# Patient Record
Sex: Female | Born: 1975 | Race: White | Hispanic: No | Marital: Married | Smoking: Former smoker
Health system: Southern US, Community
[De-identification: ages and names within clinical notes are randomized; demographics above are authoritative.]

## PROBLEM LIST (undated history)

## (undated) DIAGNOSIS — K219 Gastro-esophageal reflux disease without esophagitis: Secondary | ICD-10-CM

## (undated) DIAGNOSIS — B019 Varicella without complication: Secondary | ICD-10-CM

## (undated) DIAGNOSIS — Z975 Presence of (intrauterine) contraceptive device: Secondary | ICD-10-CM

## (undated) HISTORY — DX: Presence of (intrauterine) contraceptive device: Z97.5

## (undated) HISTORY — PX: WISDOM TOOTH EXTRACTION: SHX21

## (undated) HISTORY — DX: Gastro-esophageal reflux disease without esophagitis: K21.9

## (undated) HISTORY — DX: Varicella without complication: B01.9

---

## 2010-12-05 DIAGNOSIS — F40243 Fear of flying: Secondary | ICD-10-CM | POA: Insufficient documentation

## 2015-06-03 DIAGNOSIS — R922 Inconclusive mammogram: Secondary | ICD-10-CM | POA: Insufficient documentation

## 2015-12-13 HISTORY — PX: BREAST BIOPSY: SHX20

## 2019-07-27 LAB — HM PAP SMEAR: HM Pap smear: NEGATIVE

## 2020-07-26 LAB — HM MAMMOGRAPHY

## 2020-12-30 ENCOUNTER — Encounter: Payer: Self-pay | Admitting: Family Medicine

## 2020-12-30 ENCOUNTER — Other Ambulatory Visit: Payer: Self-pay

## 2020-12-30 ENCOUNTER — Ambulatory Visit (INDEPENDENT_AMBULATORY_CARE_PROVIDER_SITE_OTHER): Payer: BC Managed Care – PPO | Admitting: Family Medicine

## 2020-12-30 VITALS — BP 116/62 | HR 66 | Temp 98.3°F | Ht 67.32 in | Wt 154.1 lb

## 2020-12-30 DIAGNOSIS — Z13220 Encounter for screening for lipoid disorders: Secondary | ICD-10-CM

## 2020-12-30 DIAGNOSIS — Z0000 Encounter for general adult medical examination without abnormal findings: Secondary | ICD-10-CM | POA: Diagnosis not present

## 2020-12-30 DIAGNOSIS — Z1329 Encounter for screening for other suspected endocrine disorder: Secondary | ICD-10-CM | POA: Diagnosis not present

## 2020-12-30 DIAGNOSIS — Z23 Encounter for immunization: Secondary | ICD-10-CM

## 2020-12-30 DIAGNOSIS — F40243 Fear of flying: Secondary | ICD-10-CM

## 2020-12-30 MED ORDER — ALPRAZOLAM 0.5 MG PO TABS: ORAL_TABLET | ORAL | 0 refills | Status: DC

## 2020-12-30 NOTE — Assessment & Plan Note (Signed)
Well adult Orders Placed This Encounter  Procedures  . Flu Vaccine QUAD 6+ mos PF IM (Fluarix Quad PF)  . COMPLETE METABOLIC PANEL WITH GFR  . CBC  . Lipid Profile  . TSH  Screening: Lipid panel ordered.  Immunizations: Flu vaccine given. Plans to get COVID booster.  Anticipatory guidance/Risk factor reduction:  Recommendations per AVS

## 2020-12-30 NOTE — Progress Notes (Signed)
Misty Spencer - 45 y.o. female MRN [id number]  Date of birth: 1976/01/08  Subjective Chief Complaint  Patient presents with  . Establish Care    HPI Misty Spencer is a 45 y.o. female here today for initial visit to establish care.  She has been in good health.  Takes a couple OTC supplements.  Has used alprazolam when flying due to anxiety.  Has trip to [place] in a couple of months and would like a renewal of this.    She sees GYN at [facility].  Pap and Mammogram up to date.   MGF has history of colon cancer, no first degree relatives.   Exercises regularly.  Feels like her diet is healthy.    Non smoker.  Has wine a few times per week.    Had COVID last month, recovered well.  Still with mild cough.  Plans on getting booster.    Would like flu shot today.   Would like to have updated labs.    ROS:  A comprehensive ROS was completed and negative except as noted per HPI  No Known Allergies  Past Medical History:  Diagnosis Date  . IUD (intrauterine device) in place     Past Surgical History:  Procedure Laterality Date  . WISDOM TOOTH EXTRACTION      Social History   Socioeconomic History  . Marital status: Married    Spouse name: Not on file  . Number of children: 2  . Years of education: Not on file  . Highest education level: Not on file  Occupational History  . Occupation: Field seismologist  Tobacco Use  . Smoking status: Former Games developer  . Smokeless tobacco: Never Used  Vaping Use  . Vaping Use: Never used  Substance and Sexual Activity  . Alcohol use: Yes    Alcohol/week: 3.0 standard drinks    Types: 3 Standard drinks or equivalent per week  . Drug use: Never  . Sexual activity: Yes    Partners: Male    Birth control/protection: I.U.D.  Other Topics Concern  . Not on file  Social History Narrative  . Not on file   Social Determinants of Health   Financial Resource Strain: Not on file  Food Insecurity: Not on file   Transportation Needs: Not on file  Physical Activity: Not on file  Stress: Not on file  Social Connections: Not on file    Family History  Problem Relation Age of Onset  . Diabetes Father   . Colon cancer Maternal Grandfather     Health Maintenance  Topic Date Due  . Hepatitis C Screening  Never done  . COVID-19 Vaccine (1) Never done  . HIV Screening  Never done  . PAP SMEAR-Modifier  Never done  . TETANUS/TDAP  03/30/2023  . INFLUENZA VACCINE  Completed     ----------------------------------------------------------------------------------------------------------------------------------------------------------------------------------------------------------------- Physical Exam BP 116/62 (BP Location: Left Arm, Patient Position: Sitting, Cuff Size: Normal)   Pulse 66   Temp 98.3 F (36.8 C) (Oral)   Ht 5' 7.32" (1.71 m)   Wt 154 lb 1.6 oz (69.9 kg)   SpO2 100%   BMI 23.90 kg/m   Physical Exam Constitutional:      General: She is not in acute distress.    Appearance: Normal appearance. She is well-nourished.  HENT:     Head: Normocephalic and atraumatic.     Right Ear: Tympanic membrane normal.     Left Ear: Tympanic membrane normal.     Nose: Nose normal.  Mouth/Throat:     Mouth: Oropharynx is clear and moist.  Eyes:     General: No scleral icterus.    Conjunctiva/sclera: Conjunctivae normal.  Neck:     Thyroid: No thyromegaly.  Cardiovascular:     Rate and Rhythm: Normal rate and regular rhythm.     Heart sounds: Normal heart sounds.  Pulmonary:     Effort: Pulmonary effort is normal.     Breath sounds: Normal breath sounds.  Abdominal:     General: Bowel sounds are normal. There is no distension.     Palpations: Abdomen is soft.     Tenderness: There is no abdominal tenderness. There is no guarding.  Musculoskeletal:        General: No edema. Normal range of motion.     Cervical back: Normal range of motion and neck supple.  Lymphadenopathy:      Cervical: No cervical adenopathy.  Skin:    General: Skin is warm and dry.     Findings: No rash.  Neurological:     Mental Status: She is alert and oriented to person, place, and time.     Cranial Nerves: No cranial nerve deficit.     Coordination: Coordination normal.  Psychiatric:        Mood and Affect: Mood and affect normal.        Behavior: Behavior normal.     ------------------------------------------------------------------------------------------------------------------------------------------------------------------------------------------------------------------- Assessment and Plan  Well adult exam Well adult Orders Placed This Encounter  Procedures  . Flu Vaccine QUAD 6+ mos PF IM (Fluarix Quad PF)  . COMPLETE METABOLIC PANEL WITH GFR  . CBC  . Lipid Profile  . TSH  Screening: Lipid panel ordered.  Immunizations: Flu vaccine given. Plans to get COVID booster.  Anticipatory guidance/Risk factor reduction:  Recommendations per AVS  Anxiety with flying Alprazolam renewed.     Meds ordered this encounter  Medications  . ALPRAZolam (XANAX) 0.5 MG tablet    Sig: 1 tab PO half hour prior to flight.  May repeat if needed during flight.    Dispense:  15 tablet    Refill:  0    No follow-ups on file.    This visit occurred during the SARS-CoV-2 public health emergency.  Safety protocols were in place, including screening questions prior to the visit, additional usage of staff PPE, and extensive cleaning of exam room while observing appropriate contact time as indicated for disinfecting solutions.

## 2020-12-30 NOTE — Assessment & Plan Note (Signed)
Alprazolam renewed 

## 2020-12-30 NOTE — Patient Instructions (Signed)
Great to meet you today!  Preventive Care 45-45 Years Old, Female Preventive care refers to lifestyle choices and visits with your health care provider that can promote health and wellness. This includes:  A yearly physical exam. This is also called an annual wellness visit.  Regular dental and eye exams.  Immunizations.  Screening for certain conditions.  Healthy lifestyle choices, such as: ? Eating a healthy diet. ? Getting regular exercise. ? Not using drugs or products that contain nicotine and tobacco. ? Limiting alcohol use. What can I expect for my preventive care visit? Physical exam Your health care provider will check your:  Height and weight. These may be used to calculate your BMI (body mass index). BMI is a measurement that tells if you are at a healthy weight.  Heart rate and blood pressure.  Body temperature.  Skin for abnormal spots. Counseling Your health care provider may ask you questions about your:  Past medical problems.  Family's medical history.  Alcohol, tobacco, and drug use.  Emotional well-being.  Home life and relationship well-being.  Sexual activity.  Diet, exercise, and sleep habits.  Work and work Statistician.  Access to firearms.  Method of birth control.  Menstrual cycle.  Pregnancy history. What immunizations do I need? Vaccines are usually given at various ages, according to a schedule. Your health care provider will recommend vaccines for you based on your age, medical history, and lifestyle or other factors, such as travel or where you work.   What tests do I need? Blood tests  Lipid and cholesterol levels. These may be checked every 5 years, or more often if you are over 45 years old.  Hepatitis C test.  Hepatitis B test. Screening  Lung cancer screening. You may have this screening every year starting at age 45 if you have a 30-pack-year history of smoking and currently smoke or have quit within the past 15  years.  Colorectal cancer screening. ? All adults should have this screening starting at age 45 and continuing until age 39. ? Your health care provider may recommend screening at age 45 if you are at increased risk. ? You will have tests every 1-10 years, depending on your results and the type of screening test.  Diabetes screening. ? This is done by checking your blood sugar (glucose) after you have not eaten for a while (fasting). ? You may have this done every 1-3 years.  Mammogram. ? This may be done every 1-2 years. ? Talk with your health care provider about when you should start having regular mammograms. This may depend on whether you have a family history of breast cancer.  BRCA-related cancer screening. This may be done if you have a family history of breast, ovarian, tubal, or peritoneal cancers.  Pelvic exam and Pap test. ? This may be done every 3 years starting at age 20. ? Starting at age 45, this may be done every 5 years if you have a Pap test in combination with an HPV test. Other tests  STD (sexually transmitted disease) testing, if you are at risk.  Bone density scan. This is done to screen for osteoporosis. You may have this scan if you are at high risk for osteoporosis. Talk with your health care provider about your test results, treatment options, and if necessary, the need for more tests. Follow these instructions at home: Eating and drinking  Eat a diet that includes fresh fruits and vegetables, whole grains, lean protein, and low-fat dairy products.  Take vitamin and mineral supplements as recommended by your health care provider.  Do not drink alcohol if: ? Your health care provider tells you not to drink. ? You are pregnant, may be pregnant, or are planning to become pregnant.  If you drink alcohol: ? Limit how much you have to 0-1 drink a day. ? Be aware of how much alcohol is in your drink. In the [place] one drink equals one 12 oz bottle of beer  (355 mL), one 5 oz glass of wine (148 mL), or one 1 oz glass of hard liquor (44 mL).   Lifestyle  Take daily care of your teeth and gums. Brush your teeth every morning and night with fluoride toothpaste. Floss one time each day.  Stay active. Exercise for at least 30 minutes 5 or more days each week.  Do not use any products that contain nicotine or tobacco, such as cigarettes, e-cigarettes, and chewing tobacco. If you need help quitting, ask your health care provider.  Do not use drugs.  If you are sexually active, practice safe sex. Use a condom or other form of protection to prevent STIs (sexually transmitted infections).  If you do not wish to become pregnant, use a form of birth control. If you plan to become pregnant, see your health care provider for a prepregnancy visit.  If told by your health care provider, take low-dose aspirin daily starting at age 35.  Find healthy ways to cope with stress, such as: ? Meditation, yoga, or listening to music. ? Journaling. ? Talking to a trusted person. ? Spending time with friends and family. Safety  Always wear your seat belt while driving or riding in a vehicle.  Do not drive: ? If you have been drinking alcohol. Do not ride with someone who has been drinking. ? When you are tired or distracted. ? While texting.  Wear a helmet and other protective equipment during sports activities.  If you have firearms in your house, make sure you follow all gun safety procedures. What's next?  Visit your health care provider once a year for an annual wellness visit.  Ask your health care provider how often you should have your eyes and teeth checked.  Stay up to date on all vaccines. This information is not intended to replace advice given to you by your health care provider. Make sure you discuss any questions you have with your health care provider. Document Revised: 08/06/2020 Document Reviewed: 07/14/2018 Elsevier Patient Education   2021 [employer].

## 2021-01-02 LAB — COMPLETE METABOLIC PANEL WITH GFR
AG Ratio: 1.9 (calc) (ref 1.0–2.5)
ALT: 12 U/L (ref 6–29)
AST: 15 U/L (ref 10–30)
Albumin: 4.2 g/dL (ref 3.6–5.1)
Alkaline phosphatase (APISO): 44 U/L (ref 31–125)
BUN: 12 mg/dL (ref 7–25)
CO2: 27 mmol/L (ref 20–32)
Calcium: 9.1 mg/dL (ref 8.6–10.2)
Chloride: 106 mmol/L (ref 98–110)
Creat: 0.81 mg/dL (ref 0.50–1.10)
GFR, Est African American: 102 mL/min/{1.73_m2} (ref >=60)
GFR, Est Non African American: 88 mL/min/{1.73_m2} (ref >=60)
Globulin: 2.2 g/dL (calc) (ref 1.9–3.7)
Glucose, Bld: 96 mg/dL (ref 65–99)
Potassium: 4.6 mmol/L (ref 3.5–5.3)
Sodium: 140 mmol/L (ref 135–146)
Total Bilirubin: 0.6 mg/dL (ref 0.2–1.2)
Total Protein: 6.4 g/dL (ref 6.1–8.1)

## 2021-01-02 LAB — CBC
HCT: 38.6 % (ref 35.0–45.0)
Hemoglobin: 13.1 g/dL (ref 11.7–15.5)
MCH: 32.1 pg (ref 27.0–33.0)
MCHC: 33.9 g/dL (ref 32.0–36.0)
MCV: 94.6 fL (ref 80.0–100.0)
MPV: 10.7 fL (ref 7.5–12.5)
Platelets: 244 10*3/uL (ref 140–400)
RBC: 4.08 10*6/uL (ref 3.80–5.10)
RDW: 12.3 % (ref 11.0–15.0)
WBC: 4.7 10*3/uL (ref 3.8–10.8)

## 2021-01-02 LAB — TSH: TSH: 2.28 mIU/L

## 2021-01-02 LAB — LIPID PANEL
Cholesterol: 127 mg/dL (ref <200)
HDL: 64 mg/dL (ref >=50)
LDL Cholesterol (Calc): 49 mg/dL (calc)
Non-HDL Cholesterol (Calc): 63 mg/dL (calc) (ref <130)
Total CHOL/HDL Ratio: 2 (calc) (ref <5.0)
Triglycerides: 49 mg/dL (ref <150)

## 2021-01-03 ENCOUNTER — Encounter: Payer: Self-pay | Admitting: Family Medicine

## 2021-01-28 ENCOUNTER — Encounter: Payer: Self-pay | Admitting: Family Medicine

## 2021-01-28 NOTE — Progress Notes (Signed)
Negative for intraepithelial lesion or malignancy.  

## 2021-02-06 ENCOUNTER — Encounter: Payer: Self-pay | Admitting: Family Medicine

## 2021-04-09 ENCOUNTER — Other Ambulatory Visit: Payer: Self-pay

## 2021-04-09 ENCOUNTER — Ambulatory Visit (INDEPENDENT_AMBULATORY_CARE_PROVIDER_SITE_OTHER): Payer: BC Managed Care – PPO

## 2021-04-09 ENCOUNTER — Encounter: Payer: Self-pay | Admitting: Family Medicine

## 2021-04-09 ENCOUNTER — Ambulatory Visit: Payer: BC Managed Care – PPO | Admitting: Family Medicine

## 2021-04-09 VITALS — BP 99/52 | HR 72 | Temp 97.8°F | Wt 151.0 lb

## 2021-04-09 DIAGNOSIS — R058 Other specified cough: Secondary | ICD-10-CM | POA: Diagnosis not present

## 2021-04-09 DIAGNOSIS — R0989 Other specified symptoms and signs involving the circulatory and respiratory systems: Secondary | ICD-10-CM

## 2021-04-09 DIAGNOSIS — R0789 Other chest pain: Secondary | ICD-10-CM | POA: Diagnosis not present

## 2021-04-09 NOTE — Assessment & Plan Note (Signed)
Seems to be muscle strain, likely related to URI/cough that she recently had.   I have ordered a CXR.  Recommend trying OTC anti-inflammatory and heat to affected area.  She will let me know if symptoms are not improving with this.

## 2021-04-09 NOTE — Progress Notes (Signed)
Misty Spencer - 45 y.o. female MRN [id number]  Date of birth: [birth year]  Subjective Chief Complaint  Patient presents with  . Flank Pain    HPI Misty Spencer is a 45 y.o. female here today with complaint of L sided pain.  Pain located along L chest wall/flank area.  Symptoms started a few days ago.  Pain seems to worsen with movement.  She did have some constipation recently that has now resolved.  She did also have a recent respiratory infection with cough.  She denies shortness of breath, nausea, vomiting, diarrhea, dysuria, hematuria or urgency.  ROS:  A comprehensive ROS was completed and negative except as noted per HPI  No Known Allergies  Past Medical History:  Diagnosis Date  . IUD (intrauterine device) in place     Past Surgical History:  Procedure Laterality Date  . WISDOM TOOTH EXTRACTION      Social History   Socioeconomic History  . Marital status: Married    Spouse name: Not on file  . Number of children: 2  . Years of education: Not on file  . Highest education level: Not on file  Occupational History  . Occupation: Field seismologist  Tobacco Use  . Smoking status: Former Games developer  . Smokeless tobacco: Never Used  Vaping Use  . Vaping Use: Never used  Substance and Sexual Activity  . Alcohol use: Yes    Alcohol/week: 3.0 standard drinks    Types: 3 Standard drinks or equivalent per week  . Drug use: Never  . Sexual activity: Yes    Partners: Male    Birth control/protection: I.U.D.  Other Topics Concern  . Not on file  Social History Narrative  . Not on file   Social Determinants of Health   Financial Resource Strain: Not on file  Food Insecurity: Not on file  Transportation Needs: Not on file  Physical Activity: Not on file  Stress: Not on file  Social Connections: Not on file    Family History  Problem Relation Age of Onset  . Diabetes Father   . Colon cancer Maternal Grandfather     Health Maintenance  Topic Date Due  . HIV  Screening  Never done  . Hepatitis C Screening  Never done  . COVID-19 Vaccine (3 - Booster for Pfizer series) 08/04/2020  . INFLUENZA VACCINE  06/16/2021  . MAMMOGRAM  07/26/2021  . PAP SMEAR-Modifier  07/26/2022  . TETANUS/TDAP  03/30/2023  . HPV VACCINES  Aged Out     ----------------------------------------------------------------------------------------------------------------------------------------------------------------------------------------------------------------- Physical Exam BP (!) 99/52 (BP Location: Left Arm, Patient Position: Sitting, Cuff Size: Normal)   Pulse 72   Temp 97.8 F (36.6 C)   Wt 151 lb (68.5 kg)   SpO2 100%   BMI 23.42 kg/m   Physical Exam Constitutional:      Appearance: Normal appearance.  HENT:     Head: Normocephalic and atraumatic.  Eyes:     General: No scleral icterus. Cardiovascular:     Rate and Rhythm: Normal rate and regular rhythm.  Pulmonary:     Effort: Pulmonary effort is normal.     Breath sounds: Normal breath sounds.  Chest:     Chest wall: Tenderness (Mild ttp along lower ribs.) present.  Musculoskeletal:     Cervical back: Neck supple.  Neurological:     General: No focal deficit present.     Mental Status: She is alert.  Psychiatric:        Mood and Affect: Mood normal.  Behavior: Behavior normal.     ------------------------------------------------------------------------------------------------------------------------------------------------------------------------------------------------------------------- Assessment and Plan  Chest wall pain Seems to be muscle strain, likely related to URI/cough that she recently had.   I have ordered a CXR.  Recommend trying OTC anti-inflammatory and heat to affected area.  She will let me know if symptoms are not improving with this.     No orders of the defined types were placed in this encounter.   No follow-ups on file.    This visit occurred during  the SARS-CoV-2 public health emergency.  Safety protocols were in place, including screening questions prior to the visit, additional usage of staff PPE, and extensive cleaning of exam room while observing appropriate contact time as indicated for disinfecting solutions.

## 2021-04-09 NOTE — Patient Instructions (Signed)
Try heat to the area.  Try ibuprofen or aleve for a few days We'll be in touch with xray results.

## 2022-03-24 ENCOUNTER — Ambulatory Visit (INDEPENDENT_AMBULATORY_CARE_PROVIDER_SITE_OTHER): Payer: BC Managed Care – PPO

## 2022-03-24 ENCOUNTER — Ambulatory Visit: Payer: BC Managed Care – PPO | Admitting: Sports Medicine

## 2022-03-24 DIAGNOSIS — M5412 Radiculopathy, cervical region: Secondary | ICD-10-CM | POA: Insufficient documentation

## 2022-03-24 MED ORDER — GABAPENTIN 100 MG PO CAPS: ORAL_CAPSULE | ORAL | 11 refills | Status: DC

## 2022-03-24 MED ORDER — MELOXICAM 15 MG PO TABS: ORAL_TABLET | ORAL | 3 refills | Status: DC

## 2022-03-24 MED ORDER — PREDNISONE 50 MG PO TABS: ORAL_TABLET | ORAL | 0 refills | Status: DC

## 2022-03-24 NOTE — Progress Notes (Signed)
? ? ?  Procedures performed today:   ? ?None. ? ?Independent interpretation of notes and tests performed by another provider:  ? ?None. ? ?Brief History, Exam, Impression, and Recommendations:   ? ?Radiculitis of right cervical region ?Pleasant 46 year old female, has had increasing pain in the neck with radiation to the right periscapular region and down the right arm. ?Ergonomics are good at work. ?We discussed the anatomy and pathophysiology, adding prednisone, meloxicam, low-dose gabapentin at night. ?Home physical therapy. ?Return to see me in 6 weeks, MR if no better. ? ?Chronic process with exacerbation and pharmacologic intervention ? ?___________________________________________ ?Ihor Austin. Benjamin Stain, M.D., ABFM., CAQSM. ?Primary Care and Sports Medicine ?[facility] ? ?Adjunct Instructor of Family Medicine  ?[employer] ?

## 2022-03-24 NOTE — Assessment & Plan Note (Signed)
Pleasant 46 year old female, has had increasing pain in the neck with radiation to the right periscapular region and down the right arm. ?Ergonomics are good at work. ?We discussed the anatomy and pathophysiology, adding prednisone, meloxicam, low-dose gabapentin at night. ?Home physical therapy. ?Return to see me in 6 weeks, MR if no better. ?

## 2022-05-05 ENCOUNTER — Ambulatory Visit: Payer: BC Managed Care – PPO | Admitting: Sports Medicine

## 2022-05-05 DIAGNOSIS — M5412 Radiculopathy, cervical region: Secondary | ICD-10-CM

## 2022-05-05 MED ORDER — GABAPENTIN 300 MG PO CAPS: ORAL_CAPSULE | ORAL | 3 refills | Status: DC

## 2022-05-05 NOTE — Assessment & Plan Note (Signed)
Very pleasant 46 year old female, C5-C6 DDD with right-sided periscapular symptoms. Improved with prednisone, meloxicam, low-dose gabapentin but still has some soreness and stiffness in the neck. She will continue with her home conditioning. We will increase gabapentin to 300 mg. She will let me know if she like to do physical therapy with dry needling [place]. Otherwise return to see me in about 6 weeks.

## 2022-05-05 NOTE — Progress Notes (Signed)
    Procedures performed today:    None.  Independent interpretation of notes and tests performed by another provider:   None.  Brief History, Exam, Impression, and Recommendations:    Radiculitis of right cervical region Very pleasant 46 year old female, C5-C6 DDD with right-sided periscapular symptoms. Improved with prednisone, meloxicam, low-dose gabapentin but still has some soreness and stiffness in the neck. She will continue with her home conditioning. We will increase gabapentin to 300 mg. She will let me know if she like to do physical therapy with dry needling [place]. Otherwise return to see me in about 6 weeks.  Chronic process not at goal with pharmacologic intervention  ___________________________________________ Ihor Austin. Benjamin Stain, M.D., ABFM., CAQSM. Primary Care and Sports Medicine [facility]  Adjunct Instructor of Family Medicine  [employer]

## 2022-05-09 IMAGING — DX DG CERVICAL SPINE COMPLETE 4+V
6 series · 6 of 6 positions shown · non-contrast
Comparison: None Available.

CLINICAL DATA: Posterior neck pain radiating to right shoulder.

EXAM:
CERVICAL SPINE - COMPLETE 4+ VIEW

[c-spine lat]
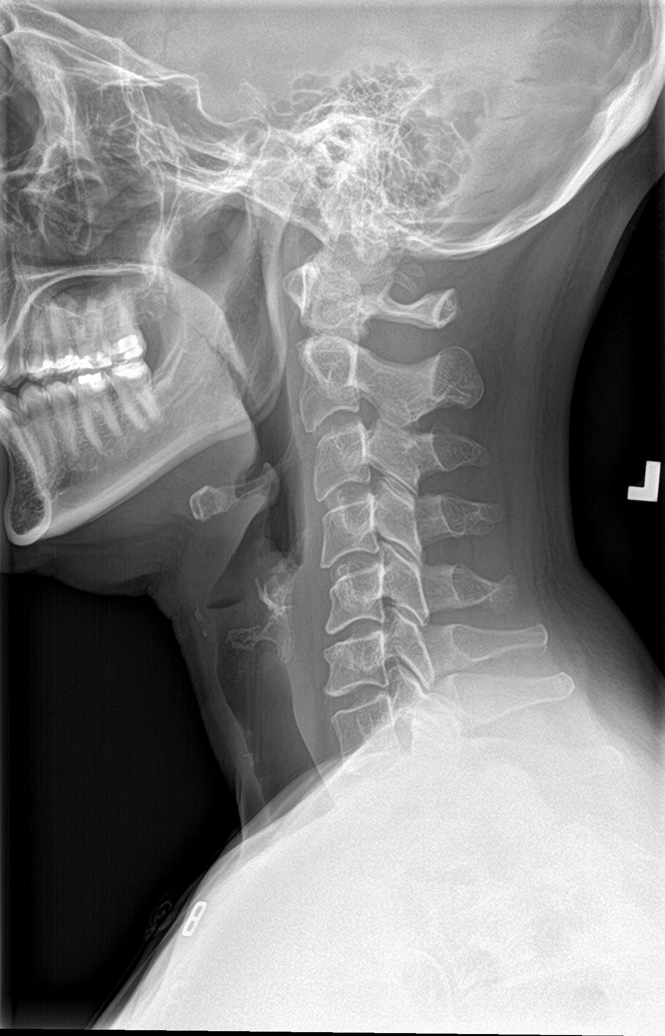

[c-spine obl (1 of 2)]
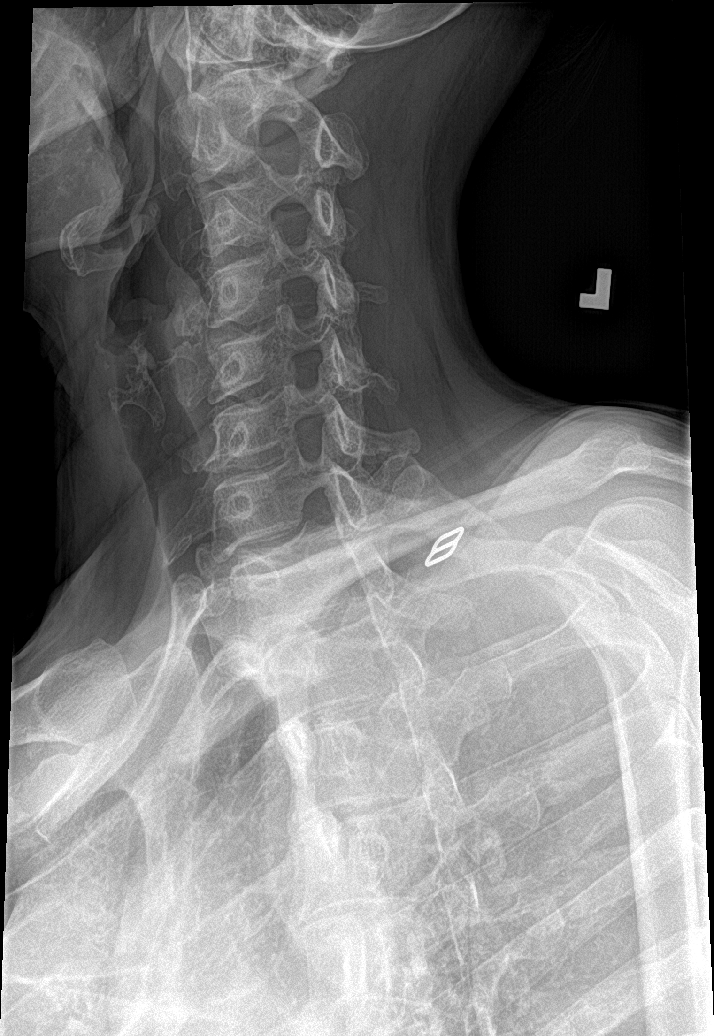

[c-spine obl (2 of 2)]
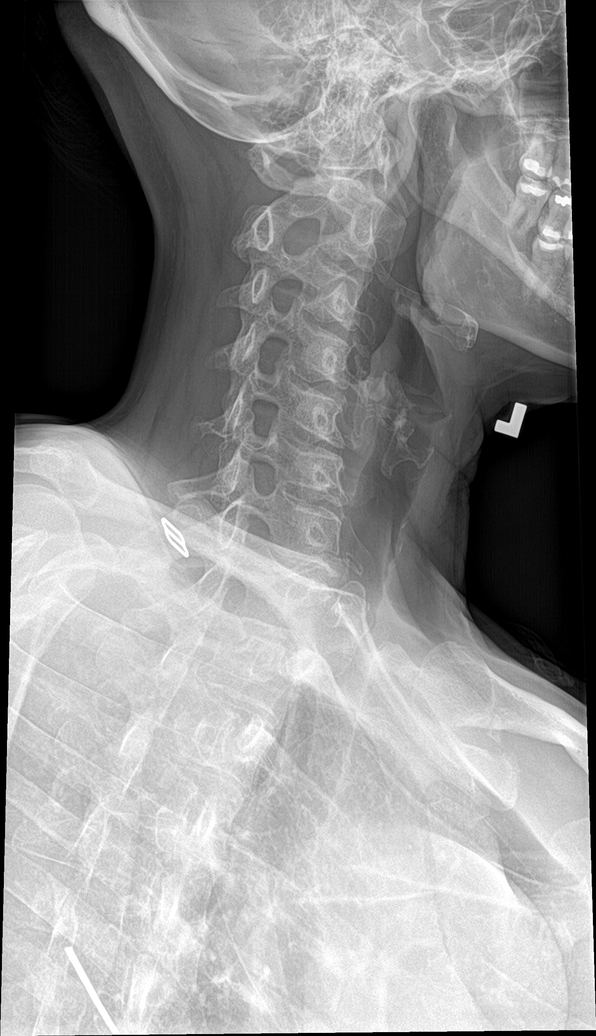

[c-spine ap]
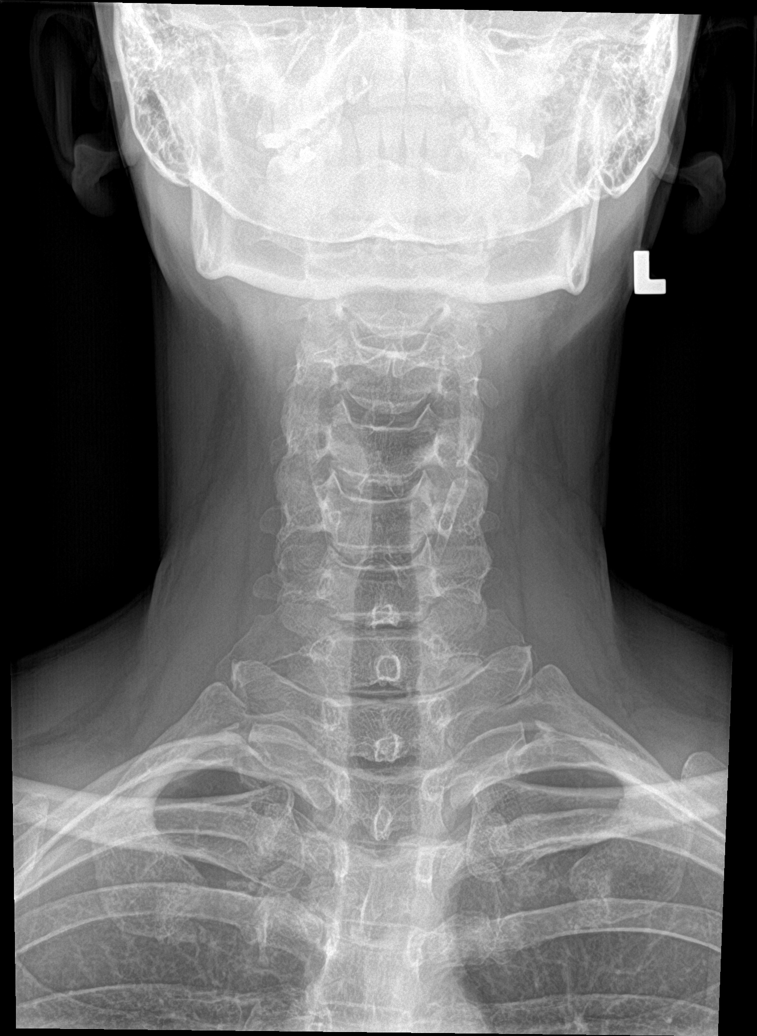

[c-spine open mouth]
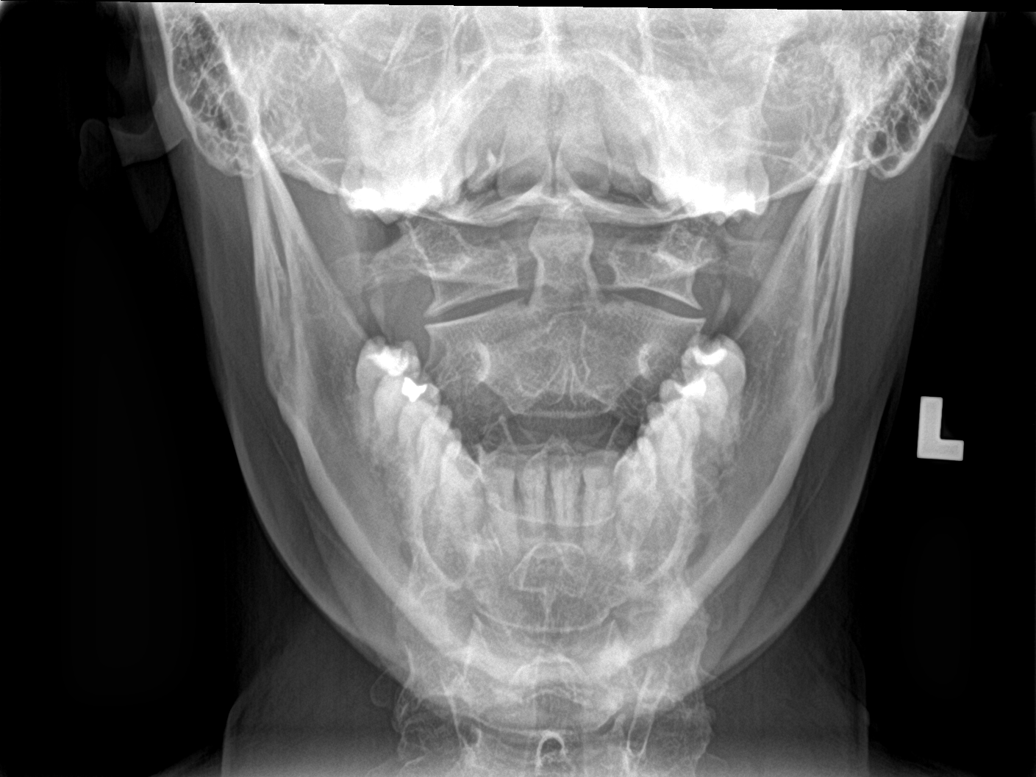

[c-spine swimmers]
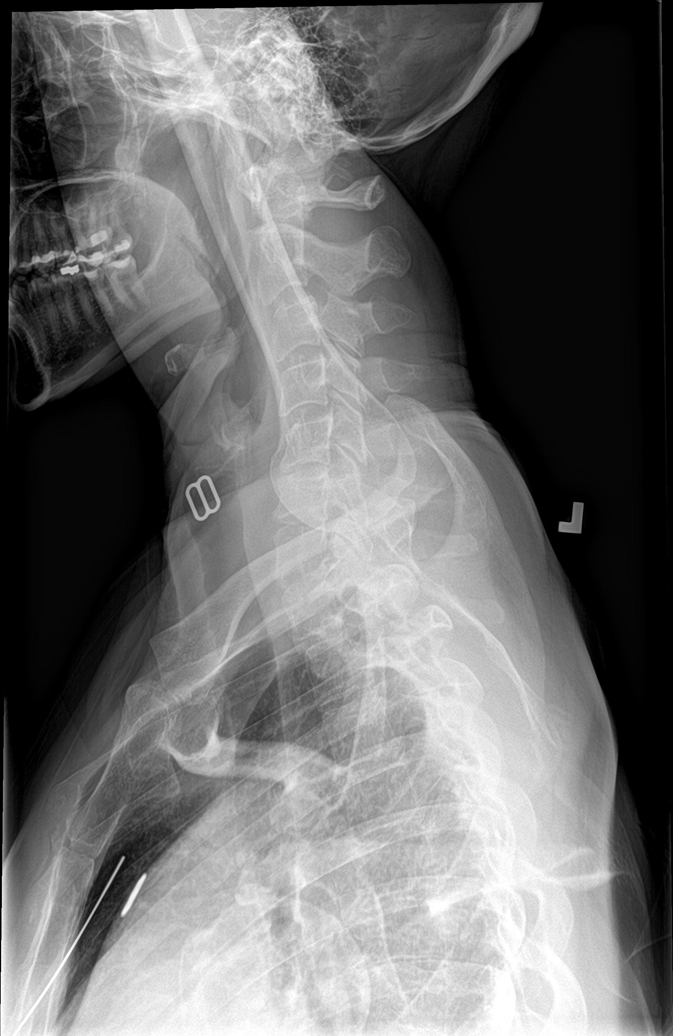

[6 of 6 positions shown; findings below may reference images not displayed]

FINDINGS: No acute fracture or subluxation of the cervical spine. There is
straightening of normal cervical lordosis which may be positional or
due to muscle spasm. Mild degenerative changes at C5-C6. The
visualized posterior elements and odontoid appear intact. The neural
foramina are patent. There is anatomic alignment of the lateral
masses of C1 and C2. The soft tissues are unremarkable.
IMPRESSION: No acute/traumatic cervical spine pathology.

## 2022-06-18 ENCOUNTER — Ambulatory Visit: Payer: BC Managed Care – PPO | Admitting: Sports Medicine

## 2022-07-06 ENCOUNTER — Ambulatory Visit: Payer: BC Managed Care – PPO | Admitting: Sports Medicine

## 2022-07-06 DIAGNOSIS — M5412 Radiculopathy, cervical region: Secondary | ICD-10-CM

## 2022-07-06 DIAGNOSIS — R55 Syncope and collapse: Secondary | ICD-10-CM | POA: Diagnosis not present

## 2022-07-06 NOTE — Progress Notes (Signed)
    Procedures performed today:    None.  Independent interpretation of notes and tests performed by another provider:   None.  Brief History, Exam, Impression, and Recommendations:    Radiculitis of right cervical region Resolved with conservative treatment, gabapentin, she can continue gabapentin as needed and return to see me as needed.  Vasomotor instability Increasing episodes of heat intolerance at night, hot flashes. Unclear as to whether her cycles are changing as she has Mirena. She is concerned about perimenopause which I think may be the case. She has done some of the lifestyle changes we typically recommend for insomnia such as mindfulness, avoiding bright screens, avoiding excessive fluid intake. She finds the gabapentin seems to help which would make sense for perimenopausal hot flashes, I advised her she can continue to take this as needed for insomnia and hot flashes, follow this up with PCP if needed.    ____________________________________________ Ihor Austin. Benjamin Stain, M.D., ABFM., CAQSM., AME. Primary Care and Sports Medicine [facility]  Adjunct Professor of Family Medicine  [employer]  Restaurant manager, fast food

## 2022-07-06 NOTE — Assessment & Plan Note (Signed)
Increasing episodes of heat intolerance at night, hot flashes. Unclear as to whether her cycles are changing as she has Mirena. She is concerned about perimenopause which I think may be the case. She has done some of the lifestyle changes we typically recommend for insomnia such as mindfulness, avoiding bright screens, avoiding excessive fluid intake. She finds the gabapentin seems to help which would make sense for perimenopausal hot flashes, I advised her she can continue to take this as needed for insomnia and hot flashes, follow this up with PCP if needed.

## 2022-07-06 NOTE — Assessment & Plan Note (Signed)
Resolved with conservative treatment, gabapentin, she can continue gabapentin as needed and return to see me as needed.

## 2022-09-14 LAB — RESULTS CONSOLE HPV
CHL HPV: NEGATIVE
CHL HPV: NEGATIVE

## 2022-09-14 LAB — HM PAP SMEAR: HM Pap smear: NEGATIVE

## 2022-09-14 LAB — HM MAMMOGRAPHY

## 2022-10-14 LAB — HM COLONOSCOPY

## 2023-05-27 ENCOUNTER — Other Ambulatory Visit: Payer: Self-pay | Admitting: Family Medicine

## 2023-06-09 ENCOUNTER — Encounter: Payer: Self-pay | Admitting: Family Medicine

## 2023-06-09 ENCOUNTER — Ambulatory Visit (INDEPENDENT_AMBULATORY_CARE_PROVIDER_SITE_OTHER): Payer: BC Managed Care – PPO | Admitting: Family Medicine

## 2023-06-09 VITALS — BP 123/70 | HR 89 | Ht 67.32 in | Wt 154.0 lb

## 2023-06-09 DIAGNOSIS — R55 Syncope and collapse: Secondary | ICD-10-CM

## 2023-06-09 DIAGNOSIS — M5412 Radiculopathy, cervical region: Secondary | ICD-10-CM | POA: Diagnosis not present

## 2023-06-09 DIAGNOSIS — F40243 Fear of flying: Secondary | ICD-10-CM | POA: Diagnosis not present

## 2023-06-09 MED ORDER — ALPRAZOLAM 0.5 MG PO TABS: ORAL_TABLET | ORAL | 0 refills | Status: DC

## 2023-06-09 MED ORDER — GABAPENTIN 300 MG PO CAPS
ORAL_CAPSULE | ORAL | 0 refills | Status: DC
Start: 2023-06-09 — End: 2024-02-24

## 2023-06-09 NOTE — Assessment & Plan Note (Signed)
Gabapentin has been effective for sleep and night sweats associated with perimenopause.  Rx renewal sent in.

## 2023-06-09 NOTE — Progress Notes (Signed)
Misty Spencer - 47 y.o. female MRN [id number]  Date of birth: 1976-06-10  Subjective Chief Complaint  Patient presents with   Medication Refill    HPI Misty Spencer is a 47 y.o. female here today for follow up visit.   She has been in pretty good health.  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  This worked well for her in the past but she needs a renewal on this.  She will be traveling to [place] soon.    Gabapentin has been effective for perimenopausal symptoms including reduction in night sweats and decreased sleep. She would like a renewal of this.   She had mammogram and pap through [facility].   ROS:  A comprehensive ROS was completed and negative except as noted per HPI    No Known Allergies  Past Medical History:  Diagnosis Date   IUD (intrauterine device) in place     Past Surgical History:  Procedure Laterality Date   WISDOM TOOTH EXTRACTION      Social History   Socioeconomic History   Marital status: Married    Spouse name: Not on file   Number of children: 2   Years of education: Not on file   Highest education level: Not on file  Occupational History   Occupation: Field seismologist  Tobacco Use   Smoking status: Former   Smokeless tobacco: Never  Vaping Use   Vaping status: Never Used  Substance and Sexual Activity   Alcohol use: Yes    Alcohol/week: 3.0 standard drinks of alcohol    Types: 3 Standard drinks or equivalent per week   Drug use: Never   Sexual activity: Yes    Partners: Male    Birth control/protection: I.U.D.  Other Topics Concern   Not on file  Social History Narrative   Not on file   Social Determinants of Health   Financial Resource Strain: Not on file  Food Insecurity: Not on file  Transportation Needs: Not on file  Physical Activity: Not on file  Stress: Not on file  Social Connections: Unknown (09/17/2022)   Received from [facility], [facility]   Social  Network    Social Network: Not on file    Family History  Problem Relation Age of Onset   Diabetes Father    Colon cancer Maternal Grandfather     Health Maintenance  Topic Date Due   DTaP/Tdap/Td (5 - Td or Tdap) 03/30/2023   MAMMOGRAM  08/17/2023 (Originally 07/26/2021)   PAP SMEAR-Modifier  09/09/2023 (Originally 07/26/2022)   COVID-19 Vaccine (3 - 2023-24 season) 09/09/2023 (Originally 07/17/2022)   Hepatitis C Screening  06/08/2024 (Originally 08/27/1994)   HIV Screening  06/08/2024 (Originally 08/28/1991)   INFLUENZA VACCINE  06/17/2023   Colonoscopy  10/14/2032   HPV VACCINES  Aged Out     ----------------------------------------------------------------------------------------------------------------------------------------------------------------------------------------------------------------- Physical Exam BP 123/70 (BP Location: Left Arm, Patient Position: Sitting, Cuff Size: Small)   Pulse 89   Ht 5' 7.32" (1.71 m)   Wt 154 lb (69.9 kg)   SpO2 99%   BMI 23.89 kg/m   Physical Exam Constitutional:      Appearance: Normal appearance.  Eyes:     General: No scleral icterus. Neurological:     General: No focal deficit present.     Mental Status: She is alert.  Psychiatric:        Mood and Affect: Mood normal.        Behavior:  Behavior normal.     ------------------------------------------------------------------------------------------------------------------------------------------------------------------------------------------------------------------- Assessment and Plan  Anxiety with flying Alprazolam has been effective for controlling anxiety with flying.  Updated rx sent in.   Vasomotor instability Gabapentin has been effective for sleep and night sweats associated with perimenopause.  Rx renewal sent in.    Meds ordered this encounter  Medications   ALPRAZolam (XANAX) 0.5 MG tablet    Sig: 1 tab PO half hour prior to flight.  May repeat if needed  during flight.    Dispense:  15 tablet    Refill:  0   gabapentin (NEURONTIN) 300 MG capsule    Sig: Take 1 cap PO at bedtime prn.    Dispense:  90 capsule    Refill:  0    No follow-ups on file.    This visit occurred during the SARS-CoV-2 public health emergency.  Safety protocols were in place, including screening questions prior to the visit, additional usage of staff PPE, and extensive cleaning of exam room while observing appropriate contact time as indicated for disinfecting solutions.

## 2023-06-09 NOTE — Assessment & Plan Note (Signed)
Alprazolam has been effective for controlling anxiety with flying.  Updated rx sent in.

## 2023-06-17 ENCOUNTER — Encounter: Payer: Self-pay | Admitting: Family Medicine

## 2023-07-27 ENCOUNTER — Ambulatory Visit: Payer: BC Managed Care – PPO | Admitting: Family Medicine

## 2023-07-29 ENCOUNTER — Encounter: Payer: Self-pay | Admitting: Family Medicine

## 2023-08-09 ENCOUNTER — Encounter: Payer: Self-pay | Admitting: Family Medicine

## 2023-08-09 ENCOUNTER — Ambulatory Visit (INDEPENDENT_AMBULATORY_CARE_PROVIDER_SITE_OTHER): Payer: BC Managed Care – PPO | Admitting: Family Medicine

## 2023-08-09 VITALS — BP 125/72 | HR 82 | Temp 97.7°F | Ht 67.5 in | Wt 154.4 lb

## 2023-08-09 DIAGNOSIS — M5412 Radiculopathy, cervical region: Secondary | ICD-10-CM | POA: Diagnosis not present

## 2023-08-09 DIAGNOSIS — F40243 Fear of flying: Secondary | ICD-10-CM | POA: Diagnosis not present

## 2023-08-09 DIAGNOSIS — Z23 Encounter for immunization: Secondary | ICD-10-CM

## 2023-08-09 DIAGNOSIS — Z7689 Persons encountering health services in other specified circumstances: Secondary | ICD-10-CM

## 2023-08-09 DIAGNOSIS — R55 Syncope and collapse: Secondary | ICD-10-CM | POA: Diagnosis not present

## 2023-08-09 MED ORDER — TRAZODONE HCL 50 MG PO TABS: 25.0000 mg | ORAL_TABLET | Freq: Every evening | ORAL | 1 refills | Status: DC | PRN

## 2023-08-09 NOTE — Progress Notes (Signed)
Patient ID: Misty Spencer, female  DOB: [birth year], 47 y.o.   MRN: [id number] Patient Care Team    Relationship Specialty Notifications Start End  Natalia Leatherwood, DO PCP - General Family Medicine  08/09/23   Monica Becton, MD Consulting Physician Sports Medicine  08/09/23   Ricka Burdock, MD  Obstetrics and Gynecology  08/09/23   Haroldine Laws, MD Referring Physician Gastroenterology  08/09/23    Comment: [facility]  [address]  [place], [place] [address]  [phone]  Genevie Cheshire, MD  [address]  [place], [place] [address]    Chief Complaint  Patient presents with   Establish Care    Subjective:  Misty Spencer is a 47 y.o.  female present for new patient establishment. All past medical history, surgical history, allergies, family history, immunizations, medications and social history were updated in the electronic medical record today. All recent labs, ED visits and hospitalizations within the last year were reviewed.  Radiculitis of right cervical region/Vasomotor instability Pt has been prescribed gabapentin 300 mg at bedtime and this medication has helped with hot flashes. She has changed her bedding and it has helped also. She does not feel the gabapentin has helped her stay asleep as well. She can fall asleep, but then wakes in the middle of the night and can not fall back asleep well. She is taking on a new role at her job and is nervous about the new role.  Anxiety with flying She has used xanax on rare occasions she has needed with flights. This has worked well for her.     08/09/2023    1:23 PM 06/09/2023   10:17 AM 12/30/2020    9:52 AM  Depression screen PHQ 2/9  Decreased Interest 1 0 0  Down, Depressed, Hopeless 1 0 0  PHQ - 2 Score 2 0 0  Altered sleeping 3  0  Tired, decreased energy 1  0  Change in appetite 1  0  Feeling bad or failure about yourself  1  0  Trouble concentrating 0  0   Moving slowly or fidgety/restless 0  0  Suicidal thoughts 0  0  PHQ-9 Score 8  0  Difficult doing work/chores Somewhat difficult  Not difficult at all      08/09/2023    1:23 PM 12/30/2020    9:52 AM  GAD 7 : Generalized Anxiety Score  Nervous, Anxious, on Edge 2 0  Control/stop worrying 2 0  Worry too much - different things 2 0  Trouble relaxing 2 0  Restless 0 0  Easily annoyed or irritable 1 0  Afraid - awful might happen 1 0  Total GAD 7 Score 10 0  Anxiety Difficulty Somewhat difficult Not difficult at all        08/09/2023    1:23 PM 06/09/2023   10:17 AM 04/09/2021   10:35 AM  Fall Risk   Falls in the past year? 0 0 0  Number falls in past yr: 0 0 0  Injury with Fall? 0 0 0  Risk for fall due to :  No Fall Risks No Fall Risks  Follow up  Falls evaluation completed Falls evaluation completed   Immunization History  Administered Date(s) Administered   Influenza Inj Mdck Quad Pf 10/14/2017   Influenza Split 08/30/2012, 08/30/2013   Influenza, Seasonal, Injecte, Preservative Fre 10/14/2017   Influenza,inj,Quad PF,6+ Mos 10/14/2016, 12/30/2020, 09/18/2022   Influenza,trivalent, recombinat, inj, PF  08/30/2012, 08/30/2013   PFIZER(Purple Top)SARS-COV-2 Vaccination 02/08/2020, 03/04/2020   Td 04/02/1993, 11/17/2003, 03/29/2013   Tdap 03/29/2013   No results found.  Past Medical History:  Diagnosis Date   Chicken pox    GERD (gastroesophageal reflux disease)    IUD (intrauterine device) in place    No Known Allergies Past Surgical History:  Procedure Laterality Date   WISDOM TOOTH EXTRACTION     Family History  Problem Relation Age of Onset   Diabetes Father    Colon cancer Maternal Grandfather    Social History   Social History Narrative   Marital status/children/pets: married, G3P2   Education/employment: Master's degree- financial services.   Safety:         -smoke alarm in the home:Yes     - wears seatbelt: Yes     - Feels safe in their  relationships: Yes       Allergies as of 08/09/2023   No Known Allergies      Medication List        Accurate as of August 09, 2023  1:46 PM. If you have any questions, ask your nurse or doctor.          ALPRAZolam 0.5 MG tablet Commonly known as: XANAX 1 tab PO half hour prior to flight.  May repeat if needed during flight.   gabapentin 300 MG capsule Commonly known as: NEURONTIN Take 1 cap PO at bedtime prn.   multivitamin tablet Take 1 tablet by mouth daily.   PROBIOTIC PO Take by mouth.   traZODone 50 MG tablet Commonly known as: DESYREL Take 0.5-1.5 tablets (25-75 mg total) by mouth at bedtime as needed for sleep. Started by: Felix Pacini        All past medical history, surgical history, allergies, family history, immunizations andmedications were updated in the EMR today and reviewed under the history and medication portions of their EMR.    No results found for this or any previous visit (from the past 2160 hour(s)).  Patient was never admitted.   ROS 14 pt review of systems performed and negative (unless mentioned in an HPI)  Objective: BP 125/72   Pulse 82   Temp 97.7 F (36.5 C)   Ht 5' 7.5" (1.715 m)   Wt 154 lb 6.4 oz (70 kg)   SpO2 96%   BMI 23.83 kg/m  Physical Exam Vitals and nursing note reviewed.  Constitutional:      General: She is not in acute distress.    Appearance: Normal appearance. She is not ill-appearing, toxic-appearing or diaphoretic.  HENT:     Head: Normocephalic and atraumatic.  Eyes:     General: No scleral icterus.       Right eye: No discharge.        Left eye: No discharge.     Extraocular Movements: Extraocular movements intact.     Conjunctiva/sclera: Conjunctivae normal.     Pupils: Pupils are equal, round, and reactive to light.  Cardiovascular:     Rate and Rhythm: Normal rate and regular rhythm.  Pulmonary:     Effort: Pulmonary effort is normal. No respiratory distress.     Breath sounds:  Normal breath sounds. No wheezing, rhonchi or rales.  Musculoskeletal:     Right lower leg: No edema.     Left lower leg: No edema.  Skin:    General: Skin is warm.     Findings: No rash.  Neurological:     Mental Status: She is alert  and oriented to person, place, and time. Mental status is at baseline.     Motor: No weakness.     Gait: Gait normal.  Psychiatric:        Mood and Affect: Mood normal.        Behavior: Behavior normal.        Thought Content: Thought content normal.        Judgment: Judgment normal.     Assessment/plan: Misty Spencer is a 47 y.o. female present for  Influenza vaccine needed Administered today  Establishing care with new doctor, encounter for Radiculitis of right cervical region/Anxiety with flying/Vasomotor instability Discussed different options for treatment and she would like to focus on her sleep pattern. Hot flashes are not as bad since she addressed her bedding.  Start trazodone 25-75 mg at bedtime.  Dc gabapentin Flight anxiety- does not need xanax refills at this time.   Return in about 24 weeks (around 01/24/2024) for cpe (20 min), Routine chronic condition follow-up.  No orders of the defined types were placed in this encounter.  Meds ordered this encounter  Medications   traZODone (DESYREL) 50 MG tablet    Sig: Take 0.5-1.5 tablets (25-75 mg total) by mouth at bedtime as needed for sleep.    Dispense:  135 tablet    Refill:  1   Referral Orders  No referral(s) requested today     Note is dictated utilizing voice recognition software. Although note has been proof read prior to signing, occasional typographical errors still can be missed. If any questions arise, please do not hesitate to call for verification.  Electronically signed by: Felix Pacini, DO [facility] [place]

## 2023-08-09 NOTE — Patient Instructions (Addendum)
Return in about 24 weeks (around 01/24/2024) for cpe (20 min), Routine chronic condition follow-up.        Great to see you today.  I have refilled the medication(s) we provide.   If labs were collected or images ordered, we will inform you of  results once we have received them and reviewed. We will contact you either by echart message, or telephone call.  Please give ample time to the testing facility, and our office to run,  receive and review results. Please do not call inquiring of results, even if you can see them in your chart. We will contact you as soon as we are able. If it has been over 1 week since the test was completed, and you have not yet heard from [place], then please call us.    - echart message- for normal results that have been seen by the patient already.   - telephone call: abnormal results or if patient has not viewed results in their echart.  If a referral to a specialist was entered for you, please call us in 2 weeks if you have not heard from the specialist office to schedule.

## 2023-08-25 ENCOUNTER — Encounter: Payer: Self-pay | Admitting: Family Medicine

## 2023-08-27 NOTE — Addendum Note (Signed)
Addended by: Filomena Jungling on: 08/27/2023 09:04 AM   Modules accepted: Orders

## 2023-09-13 LAB — HM MAMMOGRAPHY

## 2023-12-17 ENCOUNTER — Ambulatory Visit (INDEPENDENT_AMBULATORY_CARE_PROVIDER_SITE_OTHER): Payer: BC Managed Care – PPO | Admitting: Podiatry

## 2023-12-17 ENCOUNTER — Ambulatory Visit: Payer: BC Managed Care – PPO

## 2023-12-17 ENCOUNTER — Other Ambulatory Visit: Payer: Self-pay

## 2023-12-17 DIAGNOSIS — M79672 Pain in left foot: Secondary | ICD-10-CM

## 2023-12-17 DIAGNOSIS — M722 Plantar fascial fibromatosis: Secondary | ICD-10-CM

## 2023-12-17 NOTE — Patient Instructions (Signed)

## 2023-12-17 NOTE — Progress Notes (Signed)
  Subjective:  Patient ID: Misty Spencer, female    DOB: Jun 19, 1976,   MRN: [id number]  No chief complaint on file.   48 y.o. female presents for concern of left heel pain that has been ongoing for several months. She relates the bottom heel has been hurting mostly with first steps after rest or being on her feet a lot. She like to stay active and does burn boot camp. She has been avoiding high impact and recently got new shoes with superfeet but not sure how they are helping. Denies any anti-inflammatory medications . Denies any other pedal complaints. Denies n/v/f/c.   Past Medical History:  Diagnosis Date   Chicken pox    GERD (gastroesophageal reflux disease)    IUD (intrauterine device) in place     Objective:  Physical Exam: Vascular: DP/PT pulses 2/4 bilateral. CFT <3 seconds. Normal hair growth on digits. No edema.  Skin. No lacerations or abrasions bilateral feet.  Musculoskeletal: MMT 5/5 bilateral lower extremities in DF, PF, Inversion and Eversion. Deceased ROM in DF of ankle joint. Tender to the medial calcaneal tubercle left . No pain with achilles, PT or arch. No pain with calcaneal squeeze.  Neurological: Sensation intact to light touch.   Assessment:   1. Plantar fasciitis of left foot      Plan:  Patient was evaluated and treated and all questions answered. Discussed plantar fasciitis with patient.  X-rays reviewed and discussed with patient. No acute fractures or dislocations noted. Mild spurring noted at inferior calcaneus.  Discussed treatment options including, ice, NSAIDS, supportive shoes, bracing, and stretching. Stretching exercises provided to be done on a daily basis.   Prescription for meloxicam provided and sent to pharmacy. Previous CMP with kidney function wnl PF brace dispensed.  Follow-up 6 weeks or sooner if any problems arise. In the meantime, encouraged to call the office with any questions, concerns, change in symptoms.     Louann Sjogren, DPM

## 2024-01-27 ENCOUNTER — Encounter: Payer: BC Managed Care – PPO | Admitting: Family Medicine

## 2024-02-04 ENCOUNTER — Encounter: Payer: Self-pay | Admitting: Podiatry

## 2024-02-04 ENCOUNTER — Ambulatory Visit (INDEPENDENT_AMBULATORY_CARE_PROVIDER_SITE_OTHER): Payer: BC Managed Care – PPO | Admitting: Podiatry

## 2024-02-04 DIAGNOSIS — M722 Plantar fascial fibromatosis: Secondary | ICD-10-CM | POA: Diagnosis not present

## 2024-02-04 MED ORDER — DEXAMETHASONE SODIUM PHOSPHATE 120 MG/30ML IJ SOLN
4.0000 mg | Freq: Once | INTRAMUSCULAR | Status: AC
Start: 2024-02-04 — End: 2024-02-04
  Administered 2024-02-04: 4 mg via INTRA_ARTICULAR

## 2024-02-04 MED ORDER — TRIAMCINOLONE ACETONIDE 10 MG/ML IJ SUSP
2.5000 mg | Freq: Once | INTRAMUSCULAR | Status: AC
Start: 2024-02-04 — End: 2024-02-04
  Administered 2024-02-04: 2.5 mg via INTRA_ARTICULAR

## 2024-02-04 NOTE — Progress Notes (Signed)
  Subjective:  Patient ID: Misty Spencer, female    DOB: 12-01-1975,   MRN: [id number]  No chief complaint on file.   47 y.o. female presents for follow-up of left plantar fasciitis. Relates she is doing better but still having some pain. Relates about 50% better. States she has been doing a bit more with working out but still not able to run. Has been stretching and anti-inflammatories.  Denies any other pedal complaints. Denies n/v/f/c.   Past Medical History:  Diagnosis Date   Chicken pox    GERD (gastroesophageal reflux disease)    IUD (intrauterine device) in place     Objective:  Physical Exam: Vascular: DP/PT pulses 2/4 bilateral. CFT <3 seconds. Normal hair growth on digits. No edema.  Skin. No lacerations or abrasions bilateral feet.  Musculoskeletal: MMT 5/5 bilateral lower extremities in DF, PF, Inversion and Eversion. Deceased ROM in DF of ankle joint. Tender to the medial calcaneal tubercle left . No pain with achilles, PT or arch. No pain with calcaneal squeeze.  Neurological: Sensation intact to light touch.   Assessment:   1. Plantar fasciitis of left foot       Plan:  Patient was evaluated and treated and all questions answered. Discussed plantar fasciitis with patient.  X-rays reviewed and discussed with patient. No acute fractures or dislocations noted. Mild spurring noted at inferior calcaneus.  Discussed treatment options including, ice, NSAIDS, supportive shoes, bracing, and stretching. Continue brace stretching and anti-inflammatory.  Injection provided today. Procedure below.  Follow-up 6 weeks or sooner if any problems arise. In the meantime, encouraged to call the office with any questions, concerns, change in symptoms.    Procedure: Injection Tendon/Ligament Discussed alternatives, risks, complications and verbal consent was obtained.  Location: Left plantar fascia . Skin Prep: Alcohol. Injectate: 1cc 0.5% marcaine plain, 1 cc dexamethasone 0.5  cc kenalog  Disposition: Patient tolerated procedure well. Injection site dressed with a band-aid.  Post-injection care was discussed and return precautions discussed.     Louann Sjogren, DPM

## 2024-02-15 ENCOUNTER — Encounter: Payer: BC Managed Care – PPO | Admitting: Family Medicine

## 2024-02-24 ENCOUNTER — Encounter: Payer: Self-pay | Admitting: Family Medicine

## 2024-02-24 ENCOUNTER — Ambulatory Visit: Admitting: Family Medicine

## 2024-02-24 VITALS — BP 120/84 | HR 87 | Temp 98.2°F | Ht 68.0 in | Wt 157.0 lb

## 2024-02-24 DIAGNOSIS — Z23 Encounter for immunization: Secondary | ICD-10-CM | POA: Diagnosis not present

## 2024-02-24 DIAGNOSIS — F40243 Fear of flying: Secondary | ICD-10-CM | POA: Diagnosis not present

## 2024-02-24 DIAGNOSIS — Z0000 Encounter for general adult medical examination without abnormal findings: Secondary | ICD-10-CM | POA: Diagnosis not present

## 2024-02-24 DIAGNOSIS — Z13220 Encounter for screening for lipoid disorders: Secondary | ICD-10-CM

## 2024-02-24 DIAGNOSIS — Z1159 Encounter for screening for other viral diseases: Secondary | ICD-10-CM | POA: Diagnosis not present

## 2024-02-24 LAB — CBC
HCT: 40.8 % (ref 36.0–46.0)
Hemoglobin: 13.7 g/dL (ref 12.0–15.0)
MCHC: 33.5 g/dL (ref 30.0–36.0)
MCV: 94.3 fl (ref 78.0–100.0)
Platelets: 284 10*3/uL (ref 150.0–400.0)
RBC: 4.32 Mil/uL (ref 3.87–5.11)
RDW: 12.8 % (ref 11.5–15.5)
WBC: 4.8 10*3/uL (ref 4.0–10.5)

## 2024-02-24 LAB — LIPID PANEL
Cholesterol: 134 mg/dL (ref 0–200)
HDL: 67.4 mg/dL (ref >39.00)
LDL Cholesterol: 58 mg/dL (ref 0–99)
NonHDL: 66.21
Total CHOL/HDL Ratio: 2
Triglycerides: 42 mg/dL (ref 0.0–149.0)
VLDL: 8.4 mg/dL (ref 0.0–40.0)

## 2024-02-24 LAB — COMPREHENSIVE METABOLIC PANEL WITH GFR
ALT: 11 U/L (ref 0–35)
AST: 17 U/L (ref 0–37)
Albumin: 4.6 g/dL (ref 3.5–5.2)
Alkaline Phosphatase: 53 U/L (ref 39–117)
BUN: 15 mg/dL (ref 6–23)
CO2: 28 meq/L (ref 19–32)
Calcium: 9.2 mg/dL (ref 8.4–10.5)
Chloride: 104 meq/L (ref 96–112)
Creatinine, Ser: 0.84 mg/dL (ref 0.40–1.20)
GFR: 82.71 mL/min (ref >60.00)
Glucose, Bld: 87 mg/dL (ref 70–99)
Potassium: 4.4 meq/L (ref 3.5–5.1)
Sodium: 140 meq/L (ref 135–145)
Total Bilirubin: 0.4 mg/dL (ref 0.2–1.2)
Total Protein: 6.7 g/dL (ref 6.0–8.3)

## 2024-02-24 LAB — TSH: TSH: 2.25 u[IU]/mL (ref 0.35–5.50)

## 2024-02-24 MED ORDER — ALPRAZOLAM 0.5 MG PO TABS: ORAL_TABLET | ORAL | 0 refills | Status: AC

## 2024-02-24 NOTE — Progress Notes (Signed)
 Patient ID: Misty Spencer, female  DOB: July 09, 1976, 48 y.o.   MRN: [id number] Patient Care Team    Relationship Specialty Notifications Start End  Natalia Leatherwood, DO PCP - General Family Medicine  08/09/23   Monica Becton, MD Consulting Physician Sports Medicine  08/09/23   Ricka Burdock, MD  Obstetrics and Gynecology  08/09/23   Haroldine Laws, MD Referring Physician Gastroenterology  08/09/23    Comment: [facility]  [address]  [place], [place] [address]  [phone]  Genevie Cheshire, MD  [address]  [place], [place] [address]    Chief Complaint  Patient presents with   Annual Exam    And chronic condition management. Pt is fasting.     Subjective:  Misty Spencer is a 48 y.o.  Female  present for CPE and chronic condition management. All past medical history, surgical history, allergies, family history, immunizations, medications and social history were updated in the electronic medical record today. All recent labs, ED visits and hospitalizations within the last year were reviewed.  Health maintenance:  Colonoscopy: completed 10/14/2022-no polyps, by [facility], follow up 5 years for family history colon cancer. Mammogram: completed: 09/14/2022, completed gynecology-[facility]>requested Cervical cancer screening: last pap: 09/14/2022, results: Normal Pap, negative HPV, completed by: [facility]-5-year follow-up Immunizations: tdap updated today, Influenza UTD 2024 (encouraged yearly) Infectious disease screening: HIV completed, Hep C collected today DEXA: Routine screening recommended between 56-51 years old Assistive device: None Oxygen use: None Patient has a Dental home. Hospitalizations/ED visits: Reviewed   Radiculitis of right cervical region/Vasomotor instability Discussed different options for treatment and she would like to focus on her sleep pattern since gabapentin was not working well for her.  She had  stated hot flashes were not as bad since she changed her bedding.  Last visit started trazodone75 milligrams nightly.  She reports today this is did not work well for her and she ended up taking magnesium    Initial note: Pt has been prescribed gabapentin 300 mg at bedtime and this medication has helped with hot flashes. She has changed her bedding and it has helped also. She does not feel the gabapentin has helped her stay asleep as well. She can fall asleep, but then wakes in the middle of the night and can not fall back asleep well. She is taking on a new role at her job and is nervous about the new role.  Anxiety with flying She has used xanax on rare occasions she has needed with flights. This has worked well for her. She needs refill today     02/24/2024    8:11 AM 08/09/2023    1:23 PM 06/09/2023   10:17 AM 12/30/2020    9:52 AM  Depression screen PHQ 2/9  Decreased Interest 0 1 0 0  Down, Depressed, Hopeless 0 1 0 0  PHQ - 2 Score 0 2 0 0  Altered sleeping 1 3  0  Tired, decreased energy 1 1  0  Change in appetite 1 1  0  Feeling bad or failure about yourself  0 1  0  Trouble concentrating 0 0  0  Moving slowly or fidgety/restless 0 0  0  Suicidal thoughts 0 0  0  PHQ-9 Score 3 8  0  Difficult doing work/chores Not difficult at all Somewhat difficult  Not difficult at all      02/24/2024    8:11 AM 08/09/2023    1:23 PM 12/30/2020  9:52 AM  GAD 7 : Generalized Anxiety Score  Nervous, Anxious, on Edge 1 2 0  Control/stop worrying 0 2 0  Worry too much - different things 0 2 0  Trouble relaxing 1 2 0  Restless 0 0 0  Easily annoyed or irritable 1 1 0  Afraid - awful might happen 0 1 0  Total GAD 7 Score 3 10 0  Anxiety Difficulty Not difficult at all Somewhat difficult Not difficult at all           Immunization History  Administered Date(s) Administered   Influenza Inj Mdck Quad Pf 10/14/2017   Influenza Split 08/30/2012, 08/30/2013   Influenza, Seasonal,  Injecte, Preservative Fre 10/14/2017, 08/09/2023   Influenza,inj,Quad PF,6+ Mos 10/14/2016, 12/30/2020, 09/18/2022   Influenza,trivalent, recombinat, inj, PF 08/30/2012, 08/30/2013   PFIZER(Purple Top)SARS-COV-2 Vaccination 02/08/2020, 03/04/2020   Td 04/02/1993, 11/17/2003, 03/29/2013   Tdap 03/29/2013, 02/24/2024     Past Medical History:  Diagnosis Date   Chicken pox    GERD (gastroesophageal reflux disease)    IUD (intrauterine device) in place    No Known Allergies Past Surgical History:  Procedure Laterality Date   BREAST BIOPSY  12/13/2015   -  Fibroadenomatoid hyperplasia.   WISDOM TOOTH EXTRACTION     Family History  Problem Relation Age of Onset   Diabetes Father    Colon cancer Maternal Grandfather    Social History   Social History Narrative   Marital status/children/pets: married, G3P2   Education/employment: Master's degree- financial services.   Safety:         -smoke alarm in the home:Yes     - wears seatbelt: Yes     - Feels safe in their relationships: Yes       Allergies as of 02/24/2024   No Known Allergies      Medication List        Accurate as of February 24, 2024  8:23 AM. If you have any questions, ask your nurse or doctor.          STOP taking these medications    gabapentin 300 MG capsule Commonly known as: NEURONTIN Stopped by: Felix Pacini   traZODone 50 MG tablet Commonly known as: DESYREL Stopped by: Felix Pacini       TAKE these medications    ALPRAZolam 0.5 MG tablet Commonly known as: XANAX 1 tab PO half hour prior to flight.  May repeat if needed during flight.   multivitamin tablet Take 1 tablet by mouth daily.   PROBIOTIC PO Take by mouth.        All past medical history, surgical history, allergies, family history, immunizations andmedications were updated in the EMR today and reviewed under the history and medication portions of their EMR.     No results found for this or any previous visit (from  the past 2160 hours).  Patient was never admitted.   ROS 14 pt review of systems performed and negative (unless mentioned in an HPI)  Objective: BP 120/84   Pulse 87   Temp 98.2 F (36.8 C)   Ht 5\' 8"  (1.727 m)   Wt 157 lb (71.2 kg)   SpO2 98%   BMI 23.87 kg/m  Physical Exam Vitals and nursing note reviewed.  Constitutional:      General: She is not in acute distress.    Appearance: Normal appearance. She is not ill-appearing or toxic-appearing.  HENT:     Head: Normocephalic and atraumatic.     Right  Ear: Tympanic membrane, ear canal and external ear normal. There is no impacted cerumen.     Left Ear: Tympanic membrane, ear canal and external ear normal. There is no impacted cerumen.     Nose: No congestion or rhinorrhea.     Mouth/Throat:     Mouth: Mucous membranes are moist.     Pharynx: Oropharynx is clear. No oropharyngeal exudate or posterior oropharyngeal erythema.  Eyes:     General: No scleral icterus.       Right eye: No discharge.        Left eye: No discharge.     Extraocular Movements: Extraocular movements intact.     Conjunctiva/sclera: Conjunctivae normal.     Pupils: Pupils are equal, round, and reactive to light.  Cardiovascular:     Rate and Rhythm: Normal rate and regular rhythm.     Pulses: Normal pulses.     Heart sounds: Normal heart sounds. No murmur heard.    No friction rub. No gallop.  Pulmonary:     Effort: Pulmonary effort is normal. No respiratory distress.     Breath sounds: Normal breath sounds. No stridor. No wheezing, rhonchi or rales.  Chest:     Chest wall: No tenderness.  Abdominal:     General: Abdomen is flat. Bowel sounds are normal. There is no distension.     Palpations: Abdomen is soft. There is no mass.     Tenderness: There is no abdominal tenderness. There is no right CVA tenderness, left CVA tenderness, guarding or rebound.     Hernia: No hernia is present.  Musculoskeletal:        General: No swelling, tenderness  or deformity. Normal range of motion.     Cervical back: Normal range of motion and neck supple. No rigidity or tenderness.     Right lower leg: No edema.     Left lower leg: No edema.  Lymphadenopathy:     Cervical: No cervical adenopathy.  Skin:    General: Skin is warm and dry.     Coloration: Skin is not jaundiced or pale.     Findings: No bruising, erythema, lesion or rash.  Neurological:     General: No focal deficit present.     Mental Status: She is alert and oriented to person, place, and time. Mental status is at baseline.     Cranial Nerves: No cranial nerve deficit.     Sensory: No sensory deficit.     Motor: No weakness.     Coordination: Coordination normal.     Gait: Gait normal.     Deep Tendon Reflexes: Reflexes normal.  Psychiatric:        Mood and Affect: Mood normal.        Behavior: Behavior normal.        Thought Content: Thought content normal.        Judgment: Judgment normal.     No results found.  Assessment/plan: Misty Spencer is a 48 y.o. female present for CPE and chronic condition management Need for Tdap vaccination - Tdap vaccine greater than or equal to 7yo IM Encounter for hepatitis C screening test for low risk patient - Hepatitis C Antibody Lipid screening - Lipid panel Routine general medical examination at a health care facility (Primary) - CBC - Comprehensive metabolic panel with GFR - TSH Patient was encouraged to exercise greater than 150 minutes a week. Patient was encouraged to choose a diet filled with fresh fruits and vegetables, and lean meats.  AVS provided to patient today for education/recommendation on gender specific health and safety maintenance. Colonoscopy: completed 10/14/2022-no polyps, by [facility], follow up 5 years for family history colon cancer. Mammogram: completed: 09/14/2022, completed gynecology-[facility]>requested Cervical cancer screening: last pap: 09/14/2022, results: Normal Pap, negative HPV, completed by:  [facility]-5-year follow-up Immunizations: tdap updated today, Influenza UTD 2024 (encouraged yearly) Infectious disease screening: HIV completed, Hep C collected today DEXA: Routine screening recommended between 76-22 years old  Flight anxiety- does not need xanax refills at this time.refilled for her. [place] controlled database reviewed today and appropriate Return in about 1 year (around 02/24/2025) for cpe (20 min).  Orders Placed This Encounter  Procedures   Tdap vaccine greater than or equal to 7yo IM   CBC   Comprehensive metabolic panel with GFR   Lipid panel   TSH   Hepatitis C Antibody    Orders Placed This Encounter  Procedures   Tdap vaccine greater than or equal to 7yo IM   CBC   Comprehensive metabolic panel with GFR   Lipid panel   TSH   Hepatitis C Antibody   Meds ordered this encounter  Medications   ALPRAZolam (XANAX) 0.5 MG tablet    Sig: 1 tab PO half hour prior to flight.  May repeat if needed during flight.    Dispense:  30 tablet    Refill:  0   Referral Orders  No referral(s) requested today     Electronically signed by: Felix Pacini, DO [facility] [place]

## 2024-02-24 NOTE — Patient Instructions (Addendum)
 Return in about 1 year (around 02/24/2025) for cpe (20 min).        Great to see you today.  I have refilled the medication(s) we provide.   If labs were collected or images ordered, we will inform you of  results once we have received them and reviewed. We will contact you either by echart message, or telephone call.  Please give ample time to the testing facility, and our office to run,  receive and review results. Please do not call inquiring of results, even if you can see them in your chart. We will contact you as soon as we are able. If it has been over 1 week since the test was completed, and you have not yet heard from [place], then please call us.    - echart message- for normal results that have been seen by the patient already.   - telephone call: abnormal results or if patient has not viewed results in their echart.  If a referral to a specialist was entered for you, please call us in 2 weeks if you have not heard from the specialist office to schedule.

## 2024-02-25 LAB — HEPATITIS C ANTIBODY: Hepatitis C Ab: NONREACTIVE

## 2024-02-28 ENCOUNTER — Encounter: Payer: Self-pay | Admitting: Family Medicine

## 2024-03-17 ENCOUNTER — Ambulatory Visit: Admitting: Podiatry

## 2024-07-18 ENCOUNTER — Encounter: Payer: Self-pay | Admitting: Sports Medicine

## 2025-02-26 ENCOUNTER — Encounter: Admitting: Family Medicine
